# Patient Record
Sex: Male | Born: 1940 | Race: White | Hispanic: No
Health system: Southern US, Community
[De-identification: ages and names within clinical notes are randomized; demographics above are authoritative.]

---

## 2004-08-11 ENCOUNTER — Ambulatory Visit: Payer: Self-pay | Admitting: Urology

## 2006-07-08 IMAGING — NM NM BONE INJECTION
1 series · 2 of 2 positions shown · non-contrast
Comparison: none

[Series 1: 3 hr wholebody · 2.40mm/px · 2 of 2 frames shown]
[frame 1/2]
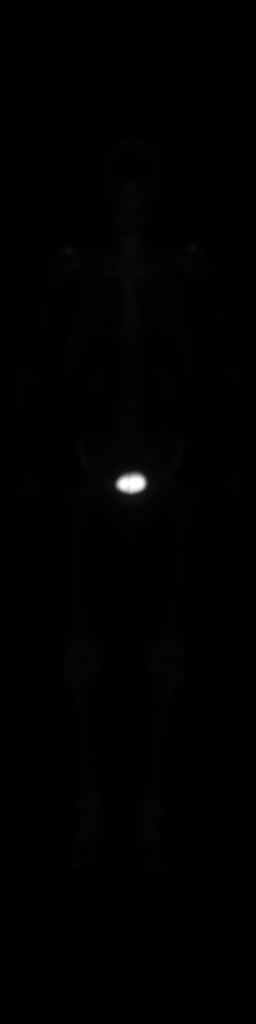
[frame 2/2]
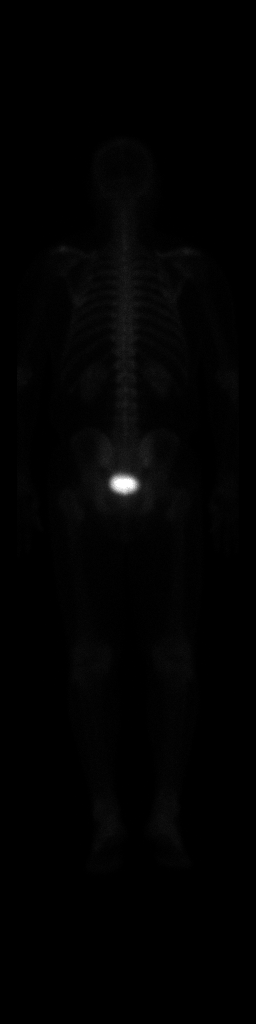

[2 of 2 positions shown; findings below may reference images not displayed]

IMAGES IMPORTED FROM THE SYNGO WORKFLOW SYSTEM
NO DICTATION FOR STUDY

## 2014-01-25 ENCOUNTER — Ambulatory Visit: Payer: Self-pay | Admitting: Internal Medicine

## 2014-02-05 ENCOUNTER — Ambulatory Visit: Payer: Self-pay | Admitting: Internal Medicine

## 2014-03-07 ENCOUNTER — Ambulatory Visit: Payer: Self-pay | Admitting: Internal Medicine

## 2014-09-12 DIAGNOSIS — R531 Weakness: Secondary | ICD-10-CM | POA: Diagnosis not present

## 2014-09-28 NOTE — Consult Note (Signed)
Reason for Visit: This 74 year old Male patient presents to the clinic for initial evaluation of  stage IV hormone prostate cancer .   Referred by [facility].  Diagnosis:  Chief Complaint/Diagnosis   74 year old male with hormone refractory prostate cancer treated with multiple agents for stage IV disease now with increasing mid back pain and CT scan showing involvement of T10-T12 paraspinal mass eroding the spinal column  Imaging Report CT scan reviewed CT scans and bone scan from [facility] requested for our review   Referral Report clinical notes reviewed   Planned Treatment Regimen palliative radiation therapy to lower thoracic spine   HPI   patient is a 74 year old male diagnosed in 2006  with Gleason 9 (4+5) presenting with a PSA of 12.5 staged T3 B. disease. He underwent salvage radiation therapy plus androgen deprivation therapy with Lupron.. 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 He underwent palliative radiation therapy from L3-4 currently patient is on the Tagamet and prednisone as well as Lupron therapy. He's developed increasing mid back pain CT scan was performed  showing paravertebral masses at T9-T10 and T12 which substantially increased in size eroded into the adjacent vertebral column. Also has multiple level compression fractures. He sounds significant narcotic analgesics for pain. Was referred to medical oncology here by mistake on Fridays now seen by me for consideration of palliative radiation therapy to his thoracic spine. He's recently been seen by hospice. Patient is not ambulating today. He is wheelchair-bound.  No focal sensory or motor levels are noted.  Past, Family and Social History:  Past Medical History positive   Respiratory pulmonary embolism   Past Surgical History exploratory laparotomy for pneumoperitoneum   Past  Medical History Comments DVT   Family History noncontributory   Social History noncontributory   Additional Past Medical and Surgical History accompanied by family member today   Allergies:   No Known Allergies:   Home Meds:  Home Medications: Medication Instructions Status  amLODIPine 10 mg oral tablet 1 tab(s) orally once a day Active  bisacodyl 5 mg oral delayed release tablet 1 tab(s) orally once a day, As Needed - for Constipation Active  docusate sodium sodium 100 mg oral capsule 1 cap(s) orally 2 times a day Active  Eliquis 2.5 mg oral tablet 1 tab(s) orally 2 times a day Active  Lupron Depot 22.5 mg/3 months intramuscular powder for injection, extended release  intramuscular once every 3 months Active  Ativan 1 mg oral tablet 1 tab(s) orally 3 times a day, As Needed - for Anxiety, Nervousness Active  MS Contin 15 mg/12 hr oral tablet, extended release 1 tab(s) orally once a day Active  MS Contin 30 mg/12 hr oral tablet, extended release 1 tab(s) orally once a day (at bedtime) Active  Zofran 4 mg oral tablet 1 tab(s) orally every 8 hours, As Needed - for Nausea, Vomiting Active  oxyCODONE 5 mg oral tablet 1 tab(s) orally every 6 hours, As Needed - for Pain Active  prochlorperazine 10 mg oral tablet 1 tab(s) orally 3 times a day, As Needed - for Nausea, Vomiting Active   Review of Systems:  Performance Status (ECOG) 1   Skin negative   Breast negative   Ophthalmologic negative   ENMT negative   Respiratory and Thorax negative   Cardiovascular negative   Gastrointestinal negative   Genitourinary see  HPI   Musculoskeletal negative   Neurological negative   Psychiatric negative   Hematology/Lymphatics negative   Endocrine negative   Allergic/Immunologic negative   Physical Exam:  General/Skin/HEENT:  Skin normal   Eyes normal   ENMT normal   Head and Neck normal   Additional PE well-developed male wheelchair-bound in moderate pain discomfort.  Motor sensory and DTR levels are equal symmetric in upper and lower extremities. Lungs are clear to A&P cardiac examination shows regular rate and rhythm. Abdomen is benign. The palpation of his thoracic spine does elicit pain.   Breasts/Resp/CV/GI/GU:  Respiratory and Thorax normal   Cardiovascular normal   Gastrointestinal normal   Genitourinary normal   MS/Neuro/Psych/Lymph:  Musculoskeletal normal   Neurological normal   Lymphatics normal   Relevent Results:   Relevant Scans and Labs CT scan of my simulator is reviewed. I [facility]   Assessment and Plan: Impression:   hormone refractory prostate cancer with progressive disease in his lower thoracic spine in [id number] year old male Plan:   the stomach ahead with palliative radiation therapy to his lower thoracic spine. Do a CT simulation I will identify the areas of paraspinal masses encompass them in her fields. We'll plan delivering 3000 cGy in 10 fractions. Another consideration this patient may beXofigo treatments and will discuss that with the patient and his family after completion of  palliative radiation to his thoracic spine. Risks and benefits of treatment were reviewed. Patient signed consent. Treatments will start this week.  I would like to take this opportunity for allowing me to participate in the care of your patient..  Fax to Physician:  Physicians To Recieve Fax: Steele Sizerrissman, Mark A, MD - [phone].  Electronic Signatures: Rebeca Alerthrystal, Kimble Delaurentis S (MD)  (Signed 24-Aug-15 13:06)  Authored: HPI, Diagnosis, PFSH, Allergies, Home Meds, ROS, Physical Exam, Relevent Results, Encounter Assessment and Plan, Fax to Physician   Last Updated: 24-Aug-15 13:06 by Rebeca Alerthrystal, Kenner Lewan S (MD)

## 2014-10-06 DEATH — deceased
